# Patient Record
Sex: Female | Born: 1995 | Race: White | Hispanic: No | Marital: Single | Smoking: Never smoker
Health system: Southern US, Community
[De-identification: ages and names within clinical notes are randomized; demographics above are authoritative.]

## PROBLEM LIST (undated history)

## (undated) DIAGNOSIS — T7840XA Allergy, unspecified, initial encounter: Secondary | ICD-10-CM

## (undated) DIAGNOSIS — K219 Gastro-esophageal reflux disease without esophagitis: Secondary | ICD-10-CM

## (undated) HISTORY — DX: Allergy, unspecified, initial encounter: T78.40XA

## (undated) HISTORY — DX: Gastro-esophageal reflux disease without esophagitis: K21.9

---
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
## 2017-10-31 ENCOUNTER — Other Ambulatory Visit: Payer: Self-pay

## 2017-10-31 ENCOUNTER — Ambulatory Visit (INDEPENDENT_AMBULATORY_CARE_PROVIDER_SITE_OTHER): Payer: 59 | Admitting: Physician Assistant

## 2017-10-31 ENCOUNTER — Encounter: Payer: Self-pay | Admitting: Physician Assistant

## 2017-10-31 VITALS — BP 122/85 | HR 91 | Temp 98.4°F | Resp 16 | Ht 62.0 in | Wt 128.8 lb

## 2017-10-31 DIAGNOSIS — Z130 Encounter for screening for diseases of the blood and blood-forming organs and certain disorders involving the immune mechanism: Secondary | ICD-10-CM | POA: Diagnosis not present

## 2017-10-31 DIAGNOSIS — Z23 Encounter for immunization: Secondary | ICD-10-CM | POA: Diagnosis not present

## 2017-10-31 DIAGNOSIS — Z3041 Encounter for surveillance of contraceptive pills: Secondary | ICD-10-CM

## 2017-10-31 DIAGNOSIS — Z1329 Encounter for screening for other suspected endocrine disorder: Secondary | ICD-10-CM

## 2017-10-31 DIAGNOSIS — Z0000 Encounter for general adult medical examination without abnormal findings: Secondary | ICD-10-CM | POA: Diagnosis not present

## 2017-10-31 DIAGNOSIS — Q078 Other specified congenital malformations of nervous system: Secondary | ICD-10-CM | POA: Diagnosis not present

## 2017-10-31 LAB — POCT URINALYSIS DIP (MANUAL ENTRY)
Bilirubin, UA: NEGATIVE
Glucose, UA: NEGATIVE mg/dL
Ketones, POC UA: NEGATIVE mg/dL
Leukocytes, UA: NEGATIVE
Nitrite, UA: NEGATIVE
Protein Ur, POC: NEGATIVE mg/dL
Spec Grav, UA: 1.02 (ref 1.010–1.025)
Urobilinogen, UA: 0.2 E.U./dL
pH, UA: 6.5 (ref 5.0–8.0)

## 2017-10-31 MED ORDER — NORGESTIM-ETH ESTRAD TRIPHASIC 0.18/0.215/0.25 MG-25 MCG PO TABS
1.0000 | ORAL_TABLET | Freq: Every day | ORAL | 11 refills | Status: DC
Start: 2017-10-31 — End: 2018-07-30

## 2017-10-31 NOTE — Patient Instructions (Addendum)
Start taking Ortho Tri-Cyclen birth control.  Come back and see me in about 6 months for recheck of your symptoms.   Ethinyl Estradiol; Norgestimate tablets What is this medicine? ETHINYL ESTRADIOL; NORGESTIMATE (ETH in il es tra DYE ole; nor JES ti mate) is an oral contraceptive. The products combine two types of female hormones, an estrogen and a progestin. They are used to prevent ovulation and pregnancy. Some products are also used to treat acne in females. This medicine may be used for other purposes; ask your health care provider or pharmacist if you have questions. COMMON BRAND NAME(S): Estarylla, MONO-LINYAH, MonoNessa, Norgestimate/Ethinyl Estradiol, Ortho Tri-Cyclen, Ortho Tri-Cyclen Lo, Ortho-Cyclen, Previfem, Sprintec, Tri-Estarylla, TRI-LINYAH, Tri-Lo-Estarylla, Tri-Lo-Marzia, Tri-Lo-Sprintec, Tri-Previfem, Tri-Sprintec, Tri-VyLibra, Trinessa, Minerva Areola What should I tell my health care provider before I take this medicine? They need to know if you have or ever had any of these conditions: -abnormal vaginal bleeding -blood vessel disease or blood clots -breast, cervical, endometrial, ovarian, liver, or uterine cancer -diabetes -gallbladder disease -heart disease or recent heart attack -high blood pressure -high cholesterol -kidney disease -liver disease -migraine headaches -stroke -systemic lupus erythematosus (SLE) -tobacco smoker -an unusual or allergic reaction to estrogens, progestins, other medicines, foods, dyes, or preservatives -pregnant or trying to get pregnant -breast-feeding How should I use this medicine? Take this medicine by mouth. To reduce nausea, this medicine may be taken with food. Follow the directions on the prescription label. Take this medicine at the same time each day and in the order directed on the package. Do not take your medicine more often than directed. Contact your pediatrician regarding the use of this medicine in children.  Special care may be needed. This medicine has been used in female children who have started having menstrual periods. A patient package insert for the product will be given with each prescription and refill. Read this sheet carefully each time. The sheet may change frequently. Overdosage: If you think you have taken too much of this medicine contact a poison control center or emergency room at once. NOTE: This medicine is only for you. Do not share this medicine with others. What if I miss a dose? If you miss a dose, refer to the patient information sheet you received with your medicine for direction. If you miss more than one pill, this medicine may not be as effective and you may need to use another form of birth control. What may interact with this medicine? Do not take this medicine with the following medication: -dasabuvir; ombitasvir; paritaprevir; ritonavir -ombitasvir; paritaprevir; ritonavir This medicine may also interact with the following medications: -acetaminophen -antibiotics or medicines for infections, especially rifampin, rifabutin, rifapentine, and griseofulvin, and possibly penicillins or tetracyclines -aprepitant -ascorbic acid (vitamin C) -atorvastatin -barbiturate medicines, such as phenobarbital -bosentan -carbamazepine -caffeine -clofibrate -cyclosporine -dantrolene -doxercalciferol -felbamate -grapefruit juice -hydrocortisone -medicines for anxiety or sleeping problems, such as diazepam or temazepam -medicines for diabetes, including pioglitazone -mineral oil -modafinil -mycophenolate -nefazodone -oxcarbazepine -phenytoin -prednisolone -ritonavir or other medicines for HIV infection or AIDS -rosuvastatin -selegiline -soy isoflavones supplements -St. John's wort -tamoxifen or raloxifene -theophylline -thyroid hormones -topiramate -warfarin This list may not describe all possible interactions. Give your health care provider a list of all the  medicines, herbs, non-prescription drugs, or dietary supplements you use. Also tell them if you smoke, drink alcohol, or use illegal drugs. Some items may interact with your medicine. What should I watch for while using this medicine? Visit your doctor or health care professional for regular checks  on your progress. You will need a regular breast and pelvic exam and Pap smear while on this medicine. You should also discuss the need for regular mammograms with your health care professional, and follow his or her guidelines for these tests. This medicine can make your body retain fluid, making your fingers, hands, or ankles swell. Your blood pressure can go up. Contact your doctor or health care professional if you feel you are retaining fluid. Use an additional method of contraception during the first cycle that you take these tablets. If you have any reason to think you are pregnant, stop taking this medicine right away and contact your doctor or health care professional. If you are taking this medicine for hormone related problems, it may take several cycles of use to see improvement in your condition. Do not use this product if you smoke and are over 9 years of age. Smoking increases the risk of getting a blood clot or having a stroke while you are taking birth control pills, especially if you are more than 20 years old. If you are a smoker who is 69 years of age or younger, you are strongly advised not to smoke while taking birth control pills. This medicine can make you more sensitive to the sun. Keep out of the sun. If you cannot avoid being in the sun, wear protective clothing and use sunscreen. Do not use sun lamps or tanning beds/booths. If you wear contact lenses and notice visual changes, or if the lenses begin to feel uncomfortable, consult your eye care specialist. In some women, tenderness, swelling, or minor bleeding of the gums may occur. Notify your dentist if this happens. Brushing and  flossing your teeth regularly may help limit this. See your dentist regularly and inform your dentist of the medicines you are taking. If you are going to have elective surgery, you may need to stop taking this medicine before the surgery. Consult your health care professional for advice. This medicine does not protect you against HIV infection (AIDS) or any other sexually transmitted diseases. What side effects may I notice from receiving this medicine? Side effects that you should report to your doctor or health care professional as soon as possible: -breast tissue changes or discharge -changes in vaginal bleeding during your period or between your periods -chest pain -coughing up blood -dizziness or fainting spells -headaches or migraines -leg, arm or groin pain -severe or sudden headaches -stomach pain (severe) -sudden shortness of breath -sudden loss of coordination, especially on one side of the body -speech problems -symptoms of vaginal infection like itching, irritation or unusual discharge -tenderness in the upper abdomen -vomiting -weakness or numbness in the arms or legs, especially on one side of the body -yellowing of the eyes or skin Side effects that usually do not require medical attention (report to your doctor or health care professional if they continue or are bothersome): -breakthrough bleeding and spotting that continues beyond the 3 initial cycles of pills -breast tenderness -mood changes, anxiety, depression, frustration, anger, or emotional outbursts -increased sensitivity to sun or ultraviolet light -nausea -skin rash, acne, or Hirt spots on the skin -weight gain (slight) This list may not describe all possible side effects. Call your doctor for medical advice about side effects. You may report side effects to FDA at 1-800-FDA-1088. Where should I keep my medicine? Keep out of the reach of children. Store at room temperature between 15 and 30 degrees C (59  and 86 degrees F). Throw away any  unused medicine after the expiration date. NOTE: This sheet is a summary. It may not cover all possible information. If you have questions about this medicine, talk to your doctor, pharmacist, or health care provider.  2018 Elsevier/Gold Standard (2016-03-18 08:09:09)   Health Maintenance, Female Adopting a healthy lifestyle and getting preventive care can go a long way to promote health and wellness. Talk with your health care provider about what schedule of regular examinations is right for you. This is a good chance for you to check in with your provider about disease prevention and staying healthy. In between checkups, there are plenty of things you can do on your own. Experts have done a lot of research about which lifestyle changes and preventive measures are most likely to keep you healthy. Ask your health care provider for more information. Weight and diet Eat a healthy diet  Be sure to include plenty of vegetables, fruits, low-fat dairy products, and lean protein.  Do not eat a lot of foods high in solid fats, added sugars, or salt.  Get regular exercise. This is one of the most important things you can do for your health. ? Most adults should exercise for at least 150 minutes each week. The exercise should increase your heart rate and make you sweat (moderate-intensity exercise). ? Most adults should also do strengthening exercises at least twice a week. This is in addition to the moderate-intensity exercise.  Maintain a healthy weight  Body mass index (BMI) is a measurement that can be used to identify possible weight problems. It estimates body fat based on height and weight. Your health care provider can help determine your BMI and help you achieve or maintain a healthy weight.  For females 57 years of age and older: ? A BMI below 18.5 is considered underweight. ? A BMI of 18.5 to 24.9 is normal. ? A BMI of 25 to 29.9 is considered  overweight. ? A BMI of 30 and above is considered obese.  Watch levels of cholesterol and blood lipids  You should start having your blood tested for lipids and cholesterol at 20 years of age, then have this test every 5 years.  You may need to have your cholesterol levels checked more often if: ? Your lipid or cholesterol levels are high. ? You are older than 20 years of age. ? You are at high risk for heart disease.  Cancer screening Lung Cancer  Lung cancer screening is recommended for adults 85-89 years old who are at high risk for lung cancer because of a history of smoking.  A yearly low-dose CT scan of the lungs is recommended for people who: ? Currently smoke. ? Have quit within the past 15 years. ? Have at least a 30-pack-year history of smoking. A pack year is smoking an average of one pack of cigarettes a day for 1 year.  Yearly screening should continue until it has been 15 years since you quit.  Yearly screening should stop if you develop a health problem that would prevent you from having lung cancer treatment.  Breast Cancer  Practice breast self-awareness. This means understanding how your breasts normally appear and feel.  It also means doing regular breast self-exams. Let your health care provider know about any changes, no matter how small.  If you are in your 20s or 30s, you should have a clinical breast exam (CBE) by a health care provider every 1-3 years as part of a regular health exam.  If you are  65 or older, have a CBE every year. Also consider having a breast X-ray (mammogram) every year.  If you have a family history of breast cancer, talk to your health care provider about genetic screening.  If you are at high risk for breast cancer, talk to your health care provider about having an MRI and a mammogram every year.  Breast cancer gene (BRCA) assessment is recommended for women who have family members with BRCA-related cancers. BRCA-related cancers  include: ? Breast. ? Ovarian. ? Tubal. ? Peritoneal cancers.  Results of the assessment will determine the need for genetic counseling and BRCA1 and BRCA2 testing.  Cervical Cancer Your health care provider may recommend that you be screened regularly for cancer of the pelvic organs (ovaries, uterus, and vagina). This screening involves a pelvic examination, including checking for microscopic changes to the surface of your cervix (Pap test). You may be encouraged to have this screening done every 3 years, beginning at age 72.  For women ages 67-65, health care providers may recommend pelvic exams and Pap testing every 3 years, or they may recommend the Pap and pelvic exam, combined with testing for human papilloma virus (HPV), every 5 years. Some types of HPV increase your risk of cervical cancer. Testing for HPV may also be done on women of any age with unclear Pap test results.  Other health care providers may not recommend any screening for nonpregnant women who are considered low risk for pelvic cancer and who do not have symptoms. Ask your health care provider if a screening pelvic exam is right for you.  If you have had past treatment for cervical cancer or a condition that could lead to cancer, you need Pap tests and screening for cancer for at least 20 years after your treatment. If Pap tests have been discontinued, your risk factors (such as having a new sexual partner) need to be reassessed to determine if screening should resume. Some women have medical problems that increase the chance of getting cervical cancer. In these cases, your health care provider may recommend more frequent screening and Pap tests.  Colorectal Cancer  This type of cancer can be detected and often prevented.  Routine colorectal cancer screening usually begins at 20 years of age and continues through 20 years of age.  Your health care provider may recommend screening at an earlier age if you have risk factors  for colon cancer.  Your health care provider may also recommend using home test kits to check for hidden blood in the stool.  A small camera at the end of a tube can be used to examine your colon directly (sigmoidoscopy or colonoscopy). This is done to check for the earliest forms of colorectal cancer.  Routine screening usually begins at age 25.  Direct examination of the colon should be repeated every 5-10 years through 20 years of age. However, you may need to be screened more often if early forms of precancerous polyps or small growths are found.  Skin Cancer  Check your skin from head to toe regularly.  Tell your health care provider about any new moles or changes in moles, especially if there is a change in a mole's shape or color.  Also tell your health care provider if you have a mole that is larger than the size of a pencil eraser.  Always use sunscreen. Apply sunscreen liberally and repeatedly throughout the day.  Protect yourself by wearing long sleeves, pants, a wide-brimmed hat, and sunglasses whenever you  are outside.  Heart disease, diabetes, and high blood pressure  High blood pressure causes heart disease and increases the risk of stroke. High blood pressure is more likely to develop in: ? People who have blood pressure in the high end of the normal range (130-139/85-89 mm Hg). ? People who are overweight or obese. ? People who are African American.  If you are 51-4 years of age, have your blood pressure checked every 3-5 years. If you are 45 years of age or older, have your blood pressure checked every year. You should have your blood pressure measured twice-once when you are at a hospital or clinic, and once when you are not at a hospital or clinic. Record the average of the two measurements. To check your blood pressure when you are not at a hospital or clinic, you can use: ? An automated blood pressure machine at a pharmacy. ? A home blood pressure monitor.  If  you are between 12 years and 83 years old, ask your health care provider if you should take aspirin to prevent strokes.  Have regular diabetes screenings. This involves taking a blood sample to check your fasting blood sugar level. ? If you are at a normal weight and have a low risk for diabetes, have this test once every three years after 20 years of age. ? If you are overweight and have a high risk for diabetes, consider being tested at a younger age or more often. Preventing infection Hepatitis B  If you have a higher risk for hepatitis B, you should be screened for this virus. You are considered at high risk for hepatitis B if: ? You were born in a country where hepatitis B is common. Ask your health care provider which countries are considered high risk. ? Your parents were born in a high-risk country, and you have not been immunized against hepatitis B (hepatitis B vaccine). ? You have HIV or AIDS. ? You use needles to inject street drugs. ? You live with someone who has hepatitis B. ? You have had sex with someone who has hepatitis B. ? You get hemodialysis treatment. ? You take certain medicines for conditions, including cancer, organ transplantation, and autoimmune conditions.  Hepatitis C  Blood testing is recommended for: ? Everyone born from [birth year] through 1963. ? Anyone with known risk factors for hepatitis C.  Sexually transmitted infections (STIs)  You should be screened for sexually transmitted infections (STIs) including gonorrhea and chlamydia if: ? You are sexually active and are younger than 20 years of age. ? You are older than 20 years of age and your health care provider tells you that you are at risk for this type of infection. ? Your sexual activity has changed since you were last screened and you are at an increased risk for chlamydia or gonorrhea. Ask your health care provider if you are at risk.  If you do not have HIV, but are at risk, it may be recommended  that you take a prescription medicine daily to prevent HIV infection. This is called pre-exposure prophylaxis (PrEP). You are considered at risk if: ? You are sexually active and do not regularly use condoms or know the HIV status of your partner(s). ? You take drugs by injection. ? You are sexually active with a partner who has HIV.  Talk with your health care provider about whether you are at high risk of being infected with HIV. If you choose to begin PrEP, you should first be  tested for HIV. You should then be tested every 3 months for as long as you are taking PrEP. Pregnancy  If you are premenopausal and you may become pregnant, ask your health care provider about preconception counseling.  If you may become pregnant, take 400 to 800 micrograms (mcg) of folic acid every day.  If you want to prevent pregnancy, talk to your health care provider about birth control (contraception). Osteoporosis and menopause  Osteoporosis is a disease in which the bones lose minerals and strength with aging. This can result in serious bone fractures. Your risk for osteoporosis can be identified using a bone density scan.  If you are 68 years of age or older, or if you are at risk for osteoporosis and fractures, ask your health care provider if you should be screened.  Ask your health care provider whether you should take a calcium or vitamin D supplement to lower your risk for osteoporosis.  Menopause may have certain physical symptoms and risks.  Hormone replacement therapy may reduce some of these symptoms and risks. Talk to your health care provider about whether hormone replacement therapy is right for you. Follow these instructions at home:  Schedule regular health, dental, and eye exams.  Stay current with your immunizations.  Do not use any tobacco products including cigarettes, chewing tobacco, or electronic cigarettes.  If you are pregnant, do not drink alcohol.  If you are  breastfeeding, limit how much and how often you drink alcohol.  Limit alcohol intake to no more than 1 drink per day for nonpregnant women. One drink equals 12 ounces of beer, 5 ounces of wine, or 1 ounces of hard liquor.  Do not use street drugs.  Do not share needles.  Ask your health care provider for help if you need support or information about quitting drugs.  Tell your health care provider if you often feel depressed.  Tell your health care provider if you have ever been abused or do not feel safe at home. This information is not intended to replace advice given to you by your health care provider. Make sure you discuss any questions you have with your health care provider. Document Released: 01/21/2011 Document Revised: 12/14/2015 Document Reviewed: 04/11/2015 Elsevier Interactive Patient Education  2018 [employer].   IF you received an x-ray today, you will receive an invoice from [facility]. Please contact [facility] at [phone] with questions or concerns regarding your invoice.   IF you received labwork today, you will receive an invoice from [facility]. Please contact [facility] at [phone] with questions or concerns regarding your invoice.   Our billing staff will not be able to assist you with questions regarding bills from these companies.  You will be contacted with the lab results as soon as they are available. The fastest way to get your results is to activate your My Chart account. Instructions are located on the last page of this paperwork. If you have not heard from [place] regarding the results in 2 weeks, please contact this office.

## 2017-10-31 NOTE — Progress Notes (Signed)
Primary Care at [place], [place] [address] [phone]  Date:  10/31/2017   Name:  Claire Ayers   DOB:  14-Jan-1996   MRN:  [id number]  PCP:  Patient, No Pcp Per    Chief Complaint: New pt (pt will be needing a PE to complete form for Nursing program at [facility]) and Birth control (pt would like to discuss going on a different Birth control, currently taken [facility], "just not doing anything for acne and cramping, that it is suppose to help with.")   History of Present Illness:  This is a 22 y.o. female with PMH hiatal hernia who is presenting for CPE. 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. She wants to go into pediatric oncology. Needs form filled out today.  Complaints: cramping and acne. She would like different birth control.  Has tried Mononessa caused heavy clotting with menses. "didn't really help with cramping and acne"  She is currently taking Syeda x 5 months - c/o acne and cramping.   LMP: yesterday Contraception: OCPs.  Last pap: n/a Sexual history: active with one female partner.  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   Diet/Exercise: healthy. Works out several days/week Fam hx: family history includes Heart disease in her sister; Hypertension in her mother.  Tobacco/alcohol/substance use: no alcohol, no drug use, never smoker.   Review of Systems:  Review of Systems  Constitutional: Negative for chills, diaphoresis, fatigue and fever.  HENT: Negative for congestion, postnasal drip, rhinorrhea, sinus pressure, sneezing and sore throat.   Respiratory: Negative for cough, chest tightness, shortness of breath and wheezing.   Cardiovascular: Negative for chest pain and palpitations.  Gastrointestinal: Negative for abdominal pain, diarrhea, nausea and vomiting.  Genitourinary: Positive for menstrual problem. Negative for decreased urine volume, difficulty urinating, dysuria, enuresis,  flank pain, frequency, hematuria and urgency.  Musculoskeletal: Negative for back pain.  Skin: Positive for wound (acne).  Neurological: Negative for dizziness, weakness, light-headedness and headaches.    Patient Active Problem List   Diagnosis Date Noted  . Gastroesophageal reflux disease without esophagitis 09/22/2015  . History of hiatal hernia 09/22/2015    Prior to Admission medications   Medication Sig Start Date End Date Taking? Authorizing Provider  drospirenone-ethinyl estradiol (SYEDA) 3-0.03 MG tablet Take 1 tablet by mouth daily.   Yes [provider]  omeprazole (PRILOSEC) 20 MG capsule Take 20 mg by mouth daily.   Yes [provider]  ranitidine (ZANTAC) 150 MG tablet Take 150 mg by mouth 2 (two) times daily.   Yes [provider]  azelastine (OPTIVAR) 0.05 % ophthalmic solution Place 2 drops into the left eye 2 (two) times daily. Can use in right eye as needed Patient not taking: Reported on 10/31/2017 05/25/16   Emeterio Reeve, DO  norgestimate-ethinyl estradiol (MONONESSA) 0.25-35 MG-MCG tablet Take 1 tablet by mouth daily.    [provider]    No Known Allergies  Social History   Tobacco Use  . Smoking status: Never Smoker  . Smokeless tobacco: Never Used  Substance Use Topics  . Alcohol use: No  . Drug use: Not on file    Family History  Problem Relation Age of Onset  . Hypertension Mother   . Heart disease Sister     Medication list has been reviewed and updated.  Physical Examination:  Physical Exam  Constitutional: She is oriented to person, place, and time. She appears well-developed  and well-nourished. No distress.  HENT:  Head: Normocephalic and atraumatic.  Mouth/Throat: Oropharynx is clear and moist.  Eyes: Pupils are equal, round, and reactive to light. Conjunctivae and EOM are normal.  Cardiovascular: Normal rate, regular rhythm and normal heart sounds.  No murmur heard. Pulmonary/Chest: Effort  normal and breath sounds normal. She has no wheezes.  Musculoskeletal: Normal range of motion.  Neurological: She is alert and oriented to person, place, and time. She has normal reflexes.  Left eye lid tremor with speaking.   Skin: Skin is warm and dry.  Psychiatric: She has a normal mood and affect. Her behavior is normal. Judgment and thought content normal.  Vitals reviewed.   BP 122/85   Pulse 91   Temp 98.4 F (36.9 C) (Oral)   Resp 16   Ht 5' 2"  (1.575 m)   Wt 128 lb 12.8 oz (58.4 kg)   LMP 10/31/2017   SpO2 97%   BMI 23.56 kg/m   Assessment and Plan: 1. 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 Tdap given. Routine labs are pending. Anticipatory guidance discussed.  Plan to start ortho-tricycline. RTC in about 6 months for recheck.  2. Need for prophylactic vaccination with combined diphtheria-tetanus-pertussis (DTP) vaccine - Tdap vaccine greater than or equal to 7yo IM  3. 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 Screening, anemia, deficiency, iron - CBC with Differential/Platelet  5. Screening for endocrine disorder - POCT urinalysis dipstick - CMP14+EGFR  6. Encounter for birth control pills maintenance - Norgestimate-Ethinyl Estradiol Triphasic 0.18/0.215/0.25 MG-25 MCG tab; Take 1 tablet by mouth daily.  Dispense: 1 Package; Refill: 11   Whitney Nasreen Goedecke, PA-C  Primary Care at [place] 10/31/2017 10:58 AM

## 2017-11-01 LAB — CBC WITH DIFFERENTIAL/PLATELET
Basophils Absolute: 0 10*3/uL (ref 0.0–0.2)
Basos: 1 %
EOS (ABSOLUTE): 0.2 10*3/uL (ref 0.0–0.4)
Eos: 3 %
Hematocrit: 40.1 % (ref 34.0–46.6)
Hemoglobin: 13.4 g/dL (ref 11.1–15.9)
Immature Grans (Abs): 0 10*3/uL (ref 0.0–0.1)
Immature Granulocytes: 0 %
Lymphocytes Absolute: 2.2 10*3/uL (ref 0.7–3.1)
Lymphs: 37 %
MCH: 29.9 pg (ref 26.6–33.0)
MCHC: 33.4 g/dL (ref 31.5–35.7)
MCV: 90 fL (ref 79–97)
Monocytes Absolute: 0.5 10*3/uL (ref 0.1–0.9)
Monocytes: 8 %
Neutrophils Absolute: 2.9 10*3/uL (ref 1.4–7.0)
Neutrophils: 51 %
Platelets: 354 10*3/uL (ref 150–379)
RBC: 4.48 x10E6/uL (ref 3.77–5.28)
RDW: 12.5 % (ref 12.3–15.4)
WBC: 5.8 10*3/uL (ref 3.4–10.8)

## 2017-11-01 LAB — CMP14+EGFR
ALT: 13 IU/L (ref 0–32)
AST: 19 IU/L (ref 0–40)
Albumin/Globulin Ratio: 1.6 (ref 1.2–2.2)
Albumin: 4.4 g/dL (ref 3.5–5.5)
Alkaline Phosphatase: 59 IU/L (ref 39–117)
BUN/Creatinine Ratio: 13 (ref 9–23)
BUN: 7 mg/dL (ref 6–20)
Bilirubin Total: 0.3 mg/dL (ref 0.0–1.2)
CO2: 24 mmol/L (ref 20–29)
Calcium: 9.6 mg/dL (ref 8.7–10.2)
Chloride: 103 mmol/L (ref 96–106)
Creatinine, Ser: 0.52 mg/dL — ABNORMAL LOW (ref 0.57–1.00)
GFR calc Af Amer: 160 mL/min/{1.73_m2} (ref >59)
GFR calc non Af Amer: 139 mL/min/{1.73_m2} (ref >59)
Globulin, Total: 2.7 g/dL (ref 1.5–4.5)
Glucose: 80 mg/dL (ref 65–99)
Potassium: 4.4 mmol/L (ref 3.5–5.2)
Sodium: 141 mmol/L (ref 134–144)
Total Protein: 7.1 g/dL (ref 6.0–8.5)

## 2017-11-19 NOTE — Progress Notes (Signed)
Recheck BMP and UA at next OV.

## 2017-11-25 ENCOUNTER — Other Ambulatory Visit: Payer: Self-pay | Admitting: Physician Assistant

## 2017-11-25 ENCOUNTER — Ambulatory Visit (INDEPENDENT_AMBULATORY_CARE_PROVIDER_SITE_OTHER): Payer: 59 | Admitting: Physician Assistant

## 2017-11-25 ENCOUNTER — Ambulatory Visit: Payer: 59 | Admitting: Physician Assistant

## 2017-11-25 DIAGNOSIS — Z111 Encounter for screening for respiratory tuberculosis: Secondary | ICD-10-CM

## 2017-11-25 NOTE — Progress Notes (Signed)
Pt came for a lab only visit. Pt was not seen by provider.  

## 2017-11-28 LAB — QUANTIFERON-TB GOLD PLUS
QuantiFERON Mitogen Value: >10 [IU]/mL
QuantiFERON Nil Value: 0.01 [IU]/mL
QuantiFERON TB1 Ag Value: 0.01 IU/mL
QuantiFERON TB2 Ag Value: 0.01 [IU]/mL
QuantiFERON-TB Gold Plus: NEGATIVE

## 2018-07-29 ENCOUNTER — Encounter: Payer: Self-pay | Admitting: Physician Assistant

## 2018-07-30 ENCOUNTER — Other Ambulatory Visit: Payer: Self-pay | Admitting: *Deleted

## 2018-07-30 DIAGNOSIS — Z3041 Encounter for surveillance of contraceptive pills: Secondary | ICD-10-CM

## 2018-07-30 MED ORDER — NORGESTIM-ETH ESTRAD TRIPHASIC 0.18/0.215/0.25 MG-25 MCG PO TABS
1.0000 | ORAL_TABLET | Freq: Every day | ORAL | 3 refills | Status: DC
Start: 2018-07-30 — End: 2018-09-22

## 2018-09-22 ENCOUNTER — Other Ambulatory Visit: Payer: Self-pay | Admitting: *Deleted

## 2018-09-22 ENCOUNTER — Encounter: Payer: Self-pay | Admitting: Physician Assistant

## 2018-09-22 DIAGNOSIS — Z3041 Encounter for surveillance of contraceptive pills: Secondary | ICD-10-CM

## 2018-09-22 MED ORDER — NORGESTIM-ETH ESTRAD TRIPHASIC 0.18/0.215/0.25 MG-25 MCG PO TABS: 1.0000 | ORAL_TABLET | Freq: Every day | ORAL | 0 refills | Status: AC

## 2019-09-01 ENCOUNTER — Other Ambulatory Visit: Payer: Self-pay

## 2019-09-01 ENCOUNTER — Telehealth (INDEPENDENT_AMBULATORY_CARE_PROVIDER_SITE_OTHER): Payer: 59 | Admitting: Registered Nurse

## 2019-09-01 ENCOUNTER — Encounter: Payer: Self-pay | Admitting: Registered Nurse

## 2019-09-01 ENCOUNTER — Ambulatory Visit: Payer: 59 | Attending: Family

## 2019-09-01 ENCOUNTER — Other Ambulatory Visit: Payer: Self-pay | Admitting: Registered Nurse

## 2019-09-01 DIAGNOSIS — R110 Nausea: Secondary | ICD-10-CM

## 2019-09-01 DIAGNOSIS — R42 Dizziness and giddiness: Secondary | ICD-10-CM

## 2019-09-01 DIAGNOSIS — R0981 Nasal congestion: Secondary | ICD-10-CM

## 2019-09-01 DIAGNOSIS — Z20822 Contact with and (suspected) exposure to covid-19: Secondary | ICD-10-CM | POA: Insufficient documentation

## 2019-09-01 DIAGNOSIS — F411 Generalized anxiety disorder: Secondary | ICD-10-CM

## 2019-09-01 MED ORDER — ESCITALOPRAM OXALATE 10 MG PO TABS: 10.0000 mg | ORAL_TABLET | Freq: Every day | ORAL | 0 refills | Status: DC

## 2019-09-01 MED ORDER — ONDANSETRON HCL 4 MG PO TABS: 4.0000 mg | ORAL_TABLET | Freq: Three times a day (TID) | ORAL | 0 refills | Status: AC | PRN

## 2019-09-01 MED ORDER — ESCITALOPRAM OXALATE 5 MG PO TABS: 5.0000 mg | ORAL_TABLET | Freq: Every day | ORAL | 0 refills | Status: DC

## 2019-09-01 MED ORDER — GUAIFENESIN-DM 100-10 MG/5ML PO SYRP: 5.0000 mL | ORAL_SOLUTION | ORAL | 0 refills | Status: AC | PRN

## 2019-09-01 MED ORDER — BUSPIRONE HCL 5 MG PO TABS: 5.0000 mg | ORAL_TABLET | Freq: Three times a day (TID) | ORAL | 0 refills | Status: DC

## 2019-09-01 MED ORDER — BUPROPION HCL ER (SR) 100 MG PO TB12: 100.0000 mg | ORAL_TABLET | Freq: Two times a day (BID) | ORAL | 0 refills | Status: DC

## 2019-09-01 MED ORDER — MECLIZINE HCL 25 MG PO TABS: 25.0000 mg | ORAL_TABLET | Freq: Three times a day (TID) | ORAL | 0 refills | Status: AC | PRN

## 2019-09-01 NOTE — Progress Notes (Signed)
Claire Ayers called back - family members who have used escitalopram have experienced weight gain. She would prefer to use an alternative Will start bupropion 100mg  SR 12h with one tab daily for one week, then increase to twice daily dosing. Buspirone 5mg  PO tid PRN for anxiety.  Pt is agreeable to plan  Keep med check visit in 4-5 weeks  , NP

## 2019-09-01 NOTE — Progress Notes (Signed)
Telemedicine Encounter- SOAP NOTE Established Patient  This telephone encounter was conducted with the patient's (or proxy's) verbal consent via audio telecommunications: yes  Patient was instructed to have this encounter in a suitably private space; and to only have persons present to whom they give permission to participate. In addition, patient identity was confirmed by use of name plus two identifiers (DOB and address).  I discussed the limitations, risks, security and privacy concerns of performing an evaluation and management service by telephone and the availability of in person appointments. I also discussed with the patient that there may be a patient responsible charge related to this service. The patient expressed understanding and agreed to proceed.  I spent a total of 21 mintues talking with the patient or their proxy.  No chief complaint on file.   Subjective   Claire Ayers is a 24 y.o. established patient. Telephone visit today for COVID symptoms and anxiety  HPI Pt states that starting 4 days ago had some cough, congestion, and shob. She got tested for COVID immediately before this visit. States that she feels "off" with a lot of dizziness leading to some nausea. No major concerns. Feels warm but no fever. Denies chills. No changes to taste or smell. No known sick contacts. In nursing school but classes online.  Also notes that she has had ongoing anxiety. Past provider gave alprazolam PRN, but she found that she had been needing to take this relatively consistently. She is interested in finding an alternative agent. Cites sources of stress as COVID, wedding planning, her father has a terminal illness, and nursing school. No  SI/HI  Patient Active Problem List   Diagnosis Date Noted  . Darrall Dears jaw-winking syndrome ([place]) 10/31/2017  . Gastroesophageal reflux disease without esophagitis 09/22/2015  . History of hiatal hernia 09/22/2015    Past Medical History:   Diagnosis Date  . Allergy   . GERD (gastroesophageal reflux disease)     Current Outpatient Medications  Medication Sig Dispense Refill  . azelastine (OPTIVAR) 0.05 % ophthalmic solution Place 2 drops into the left eye 2 (two) times daily. Can use in right eye as needed (Patient not taking: Reported on 10/31/2017) 6 mL 1  . drospirenone-ethinyl estradiol (SYEDA) 3-0.03 MG tablet Take 1 tablet by mouth daily.    Marland Kitchen escitalopram (LEXAPRO) 10 MG tablet Take 1 tablet (10 mg total) by mouth daily. 45 tablet 0  . escitalopram (LEXAPRO) 5 MG tablet Take 1 tablet (5 mg total) by mouth daily. 7 tablet 0  . guaiFENesin-dextromethorphan (ROBITUSSIN DM) 100-10 MG/5ML syrup Take 5 mLs by mouth every 4 (four) hours as needed for cough. 118 mL 0  . meclizine (ANTIVERT) 25 MG tablet Take 1 tablet (25 mg total) by mouth 3 (three) times daily as needed for dizziness. 30 tablet 0  . norgestimate-ethinyl estradiol (MONONESSA) 0.25-35 MG-MCG tablet Take 1 tablet by mouth daily.    . Norgestimate-Ethinyl Estradiol Triphasic 0.18/0.215/0.25 MG-25 MCG tab Take 1 tablet by mouth daily. 3 Package 0  . omeprazole (PRILOSEC) 20 MG capsule Take 20 mg by mouth daily.    . ondansetron (ZOFRAN) 4 MG tablet Take 1 tablet (4 mg total) by mouth every 8 (eight) hours as needed for nausea or vomiting. 20 tablet 0  . ranitidine (ZANTAC) 150 MG tablet Take 150 mg by mouth 2 (two) times daily.     No current facility-administered medications for this visit.    No Known Allergies  Social History  Socioeconomic History  . Marital status: Single    Spouse name: Not on file  . Number of children: Not on file  . Years of education: Not on file  . Highest education level: Not on file  Occupational History  . Not on file  Tobacco Use  . Smoking status: Never Smoker  . Smokeless tobacco: Never Used  Substance and Sexual Activity  . Alcohol use: No  . Drug use: Not on file  . Sexual activity: Not on file  Other Topics  Concern  . Not on file  Social History Narrative  . Not on file   Social Determinants of Health   Financial Resource Strain:   . Difficulty of Paying Living Expenses: Not on file  Food Insecurity:   . Worried About Programme researcher, broadcasting/film/video in the Last Year: Not on file  . Ran Out of Food in the Last Year: Not on file  Transportation Needs:   . Lack of Transportation (Medical): Not on file  . Lack of Transportation (Non-Medical): Not on file  Physical Activity:   . Days of Exercise per Week: Not on file  . Minutes of Exercise per Session: Not on file  Stress:   . Feeling of Stress : Not on file  Social Connections:   . Frequency of Communication with Friends and Family: Not on file  . Frequency of Social Gatherings with Friends and Family: Not on file  . Attends Religious Services: Not on file  . Active Member of Clubs or Organizations: Not on file  . Attends Banker Meetings: Not on file  . Marital Status: Not on file  Intimate Partner Violence:   . Fear of Current or Ex-Partner: Not on file  . Emotionally Abused: Not on file  . Physically Abused: Not on file  . Sexually Abused: Not on file    ROS Per hpi   Objective   Vitals as reported by the patient: There were no vitals filed for this visit.  Diagnoses and all orders for this visit:  Nausea without vomiting -     meclizine (ANTIVERT) 25 MG tablet; Take 1 tablet (25 mg total) by mouth 3 (three) times daily as needed for dizziness. -     ondansetron (ZOFRAN) 4 MG tablet; Take 1 tablet (4 mg total) by mouth every 8 (eight) hours as needed for nausea or vomiting.  Dizziness -     meclizine (ANTIVERT) 25 MG tablet; Take 1 tablet (25 mg total) by mouth 3 (three) times daily as needed for dizziness.  Head congestion -     guaiFENesin-dextromethorphan (ROBITUSSIN DM) 100-10 MG/5ML syrup; Take 5 mLs by mouth every 4 (four) hours as needed for cough.  GAD (generalized anxiety disorder) -     escitalopram  (LEXAPRO) 5 MG tablet; Take 1 tablet (5 mg total) by mouth daily. -     escitalopram (LEXAPRO) 10 MG tablet; Take 1 tablet (10 mg total) by mouth daily.   PLAN  Guaifenesin-dextromethorphan, meclizine, and ondansetron for symptoms. Discussed red flags and reasons to return to clinic. Discussed supportive care.  Escitalopram: start with 5mg  PO qd for one week, then titrate up to 10mg  PO qd. This should help with her anxiety more appropriately than alprazolam. Will plan to have med check with her in 1 mo via telemed for this medication.  Patient encouraged to call clinic with any questions, comments, or concerns.   I discussed the assessment and treatment plan with the patient. The patient was  provided an opportunity to ask questions and all were answered. The patient agreed with the plan and demonstrated an understanding of the instructions.   The patient was advised to call back or seek an in-person evaluation if the symptoms worsen or if the condition fails to improve as anticipated.  I provided 21 minutes of non-face-to-face time during this encounter.  Janeece Agee, NP  Primary Care at [facility]

## 2019-09-01 NOTE — Telephone Encounter (Signed)
Spoke with patient and she would like another prescription. Claire Ayers is reaching out to the patient

## 2019-09-02 LAB — NOVEL CORONAVIRUS, NAA: SARS-CoV-2, NAA: NOT DETECTED

## 2019-09-03 ENCOUNTER — Telehealth: Payer: Self-pay | Admitting: Registered Nurse

## 2019-09-03 ENCOUNTER — Telehealth: Payer: Self-pay | Admitting: General Practice

## 2019-09-03 NOTE — Progress Notes (Signed)
Called pt LVM for her to call office . Pt needs to make appt with Rich within the next 4-5 weeks for med check

## 2019-09-03 NOTE — Telephone Encounter (Signed)
error 

## 2019-09-03 NOTE — Telephone Encounter (Signed)
Called pt LVM for her to call office . Pt needs to make appt with Rich within the next 4-5 weeks for med check  

## 2019-09-08 ENCOUNTER — Ambulatory Visit: Payer: 59 | Admitting: Registered Nurse

## 2019-09-08 ENCOUNTER — Other Ambulatory Visit: Payer: Self-pay

## 2019-09-08 ENCOUNTER — Encounter: Payer: Self-pay | Admitting: Registered Nurse

## 2019-09-08 VITALS — BP 110/72 | HR 83 | Temp 98.0°F | Ht 62.0 in | Wt 131.4 lb

## 2019-09-08 DIAGNOSIS — R0981 Nasal congestion: Secondary | ICD-10-CM

## 2019-09-08 DIAGNOSIS — J0110 Acute frontal sinusitis, unspecified: Secondary | ICD-10-CM

## 2019-09-08 MED ORDER — AMOXICILLIN 875 MG PO TABS: 875.0000 mg | ORAL_TABLET | Freq: Two times a day (BID) | ORAL | 0 refills | Status: DC

## 2019-09-08 MED ORDER — OXYMETAZOLINE HCL 0.05 % NA SOLN: 1.0000 | Freq: Two times a day (BID) | NASAL | 0 refills | Status: AC

## 2019-09-08 MED ORDER — FLUTICASONE PROPIONATE 50 MCG/ACT NA SUSP: 2.0000 | Freq: Every day | NASAL | 6 refills | Status: AC

## 2019-09-08 NOTE — Patient Instructions (Signed)
° ° ° °  If you have lab work done today you will be contacted with your lab results within the next 2 weeks.  If you have not heard from us then please contact us. The fastest way to get your results is to register for My Chart. ° ° °IF you received an x-ray today, you will receive an invoice from [facility]. Please contact [facility] at [phone] with questions or concerns regarding your invoice.  ° °IF you received labwork today, you will receive an invoice from [facility]. Please contact [facility] at [phone] with questions or concerns regarding your invoice.  ° °Our billing staff will not be able to assist you with questions regarding bills from these companies. ° °You will be contacted with the lab results as soon as they are available. The fastest way to get your results is to activate your My Chart account. Instructions are located on the last page of this paperwork. If you have not heard from us regarding the results in 2 weeks, please contact this office. °  ° ° ° °

## 2019-09-10 ENCOUNTER — Other Ambulatory Visit: Payer: Self-pay | Admitting: Registered Nurse

## 2019-09-10 DIAGNOSIS — J0110 Acute frontal sinusitis, unspecified: Secondary | ICD-10-CM

## 2019-09-10 MED ORDER — AMOXICILLIN 875 MG PO TABS: 875.0000 mg | ORAL_TABLET | Freq: Two times a day (BID) | ORAL | 0 refills | Status: AC

## 2019-09-10 NOTE — Telephone Encounter (Signed)
I was calling the pharmacy to see if they have received the prescription but they are currently not open.

## 2019-09-13 ENCOUNTER — Encounter: Payer: Self-pay | Admitting: Registered Nurse

## 2019-09-13 ENCOUNTER — Other Ambulatory Visit: Payer: Self-pay | Admitting: Registered Nurse

## 2019-09-13 DIAGNOSIS — K219 Gastro-esophageal reflux disease without esophagitis: Secondary | ICD-10-CM

## 2019-09-13 MED ORDER — OMEPRAZOLE 40 MG PO CPDR: 40.0000 mg | DELAYED_RELEASE_CAPSULE | Freq: Every day | ORAL | 3 refills | Status: AC

## 2019-09-13 NOTE — Telephone Encounter (Signed)
Patient Prescription for Prilosec was not at the pharmacy , and wanted to know about the high dosage.

## 2019-09-14 ENCOUNTER — Encounter: Payer: Self-pay | Admitting: Registered Nurse

## 2019-09-14 NOTE — Progress Notes (Signed)
Acute Office Visit  Subjective:    Patient ID: Claire Ayers, female    DOB: Dec 18, 1995, 24 y.o.   MRN: [id number]  Chief Complaint  Patient presents with  . Headache    patient has had a headache for the past 3-4 goes away and comes right back , she said it feel like pressure behind forehead and eyes hurt as well. per patient she has taken some tylenol for some relief , but not helping.     HPI Patient is in today for ongoing sinusitis. Headache worsening. Pressure behind eyes. OTCs not providing relief. No lower respiratory symptoms. No nvd or fever. No cough. Denies GI symptoms. Denies sick contacts. Usually gets sinus infections about once each year - this feels similar.   Past Medical History:  Diagnosis Date  . Allergy   . GERD (gastroesophageal reflux disease)     History reviewed. No pertinent surgical history.  Family History  Problem Relation Age of Onset  . Hypertension Mother   . Heart disease Sister     Social History   Socioeconomic History  . Marital status: Single    Spouse name: Not on file  . Number of children: Not on file  . Years of education: Not on file  . Highest education level: Not on file  Occupational History  . Not on file  Tobacco Use  . Smoking status: Never Smoker  . Smokeless tobacco: Never Used  Substance and Sexual Activity  . Alcohol use: No  . Drug use: Not on file  . Sexual activity: Not on file  Other Topics Concern  . Not on file  Social History Narrative  . Not on file   Social Determinants of Health   Financial Resource Strain:   . Difficulty of Paying Living Expenses: Not on file  Food Insecurity:   . Worried About Programme researcher, broadcasting/film/video in the Last Year: Not on file  . Ran Out of Food in the Last Year: Not on file  Transportation Needs:   . Lack of Transportation (Medical): Not on file  . Lack of Transportation (Non-Medical): Not on file  Physical Activity:   . Days of Exercise per Week: Not on file  . Minutes  of Exercise per Session: Not on file  Stress:   . Feeling of Stress : Not on file  Social Connections:   . Frequency of Communication with Friends and Family: Not on file  . Frequency of Social Gatherings with Friends and Family: Not on file  . Attends Religious Services: Not on file  . Active Member of Clubs or Organizations: Not on file  . Attends Banker Meetings: Not on file  . Marital Status: Not on file  Intimate Partner Violence:   . Fear of Current or Ex-Partner: Not on file  . Emotionally Abused: Not on file  . Physically Abused: Not on file  . Sexually Abused: Not on file    Outpatient Medications Prior to Visit  Medication Sig Dispense Refill  . buPROPion (WELLBUTRIN SR) 100 MG 12 hr tablet Take 1 tablet (100 mg total) by mouth 2 (two) times daily. Once daily dosing for first 7 days 90 tablet 0  . busPIRone (BUSPAR) 5 MG tablet Take 1 tablet (5 mg total) by mouth 3 (three) times daily. 90 tablet 0  . guaiFENesin-dextromethorphan (ROBITUSSIN DM) 100-10 MG/5ML syrup Take 5 mLs by mouth every 4 (four) hours as needed for cough. 118 mL 0  . meclizine (ANTIVERT) 25  MG tablet Take 1 tablet (25 mg total) by mouth 3 (three) times daily as needed for dizziness. 30 tablet 0  . Norgestimate-Ethinyl Estradiol Triphasic 0.18/0.215/0.25 MG-25 MCG tab Take 1 tablet by mouth daily. 3 Package 0  . ondansetron (ZOFRAN) 4 MG tablet Take 1 tablet (4 mg total) by mouth every 8 (eight) hours as needed for nausea or vomiting. 20 tablet 0  . ranitidine (ZANTAC) 150 MG tablet Take 150 mg by mouth 2 (two) times daily.    Marland Kitchen omeprazole (PRILOSEC) 20 MG capsule Take 20 mg by mouth daily.    Marland Kitchen azelastine (OPTIVAR) 0.05 % ophthalmic solution Place 2 drops into the left eye 2 (two) times daily. Can use in right eye as needed (Patient not taking: Reported on 10/31/2017) 6 mL 1  . drospirenone-ethinyl estradiol (SYEDA) 3-0.03 MG tablet Take 1 tablet by mouth daily.    . norgestimate-ethinyl  estradiol (MONONESSA) 0.25-35 MG-MCG tablet Take 1 tablet by mouth daily.     No facility-administered medications prior to visit.    No Known Allergies  Review of Systems  Constitutional: Negative.   HENT: Positive for congestion, sinus pressure and sinus pain. Negative for dental problem, drooling, ear discharge, ear pain, facial swelling, hearing loss, mouth sores, nosebleeds, postnasal drip, rhinorrhea, sneezing, sore throat, tinnitus, trouble swallowing and voice change.   Eyes: Negative.   Respiratory: Negative.   Cardiovascular: Negative.   Gastrointestinal: Negative.   Endocrine: Negative.   Genitourinary: Negative.   Musculoskeletal: Negative.   Skin: Negative.   Allergic/Immunologic: Negative.   Neurological: Negative.   Hematological: Negative.   Psychiatric/Behavioral: Negative.   All other systems reviewed and are negative.      Objective:    Physical Exam Vitals and nursing note reviewed.  Constitutional:      General: She is not in acute distress.    Appearance: She is well-developed and normal weight. She is not ill-appearing, toxic-appearing or diaphoretic.  HENT:     Head: Normocephalic and atraumatic.     Mouth/Throat:     Mouth: Mucous membranes are moist.     Pharynx: Oropharynx is clear.  Cardiovascular:     Heart sounds: Normal heart sounds. No murmur.  Pulmonary:     Effort: Pulmonary effort is normal. No respiratory distress.  Musculoskeletal:     Cervical back: Normal range of motion and neck supple.  Lymphadenopathy:     Cervical: No cervical adenopathy.  Skin:    General: Skin is warm and dry.     Capillary Refill: Capillary refill takes less than 2 seconds.     Coloration: Skin is not cyanotic or pale.     Findings: No erythema or rash.  Neurological:     Mental Status: She is alert.  Psychiatric:        Mood and Affect: Mood normal. Mood is not anxious or depressed.        Speech: Speech normal.        Behavior: Behavior normal.  Behavior is not agitated.        Cognition and Memory: Cognition is not impaired. Memory is not impaired.     BP 110/72   Pulse 83   Temp 98 F (36.7 C) (Temporal)   Ht 5\' 2"  (1.575 m)   Wt 131 lb 6.4 oz (59.6 kg)   BMI 24.03 kg/m  Wt Readings from Last 3 Encounters:  09/08/19 131 lb 6.4 oz (59.6 kg)  10/31/17 128 lb 12.8 oz (58.4 kg) (52 %, Z= 0.06)*  05/25/16 128 lb (58.1 kg) (57 %, Z= 0.19)*   * Growth percentiles are based on CDC (Girls, 2-20 Years) data.    Health Maintenance Due  Topic Date Due  . HIV Screening  04/16/2013  . PAP-Cervical Cytology Screening  04/17/2019  . PAP SMEAR-Modifier  04/17/2019    There are no preventive care reminders to display for this patient.   No results found for: TSH Lab Results  Component Value Date   WBC 5.8 10/31/2017   HGB 13.4 10/31/2017   HCT 40.1 10/31/2017   MCV 90 10/31/2017   PLT 354 10/31/2017   Lab Results  Component Value Date   NA 141 10/31/2017   K 4.4 10/31/2017   CO2 24 10/31/2017   GLUCOSE 80 10/31/2017   BUN 7 10/31/2017   CREATININE 0.52 (L) 10/31/2017   BILITOT 0.3 10/31/2017   ALKPHOS 59 10/31/2017   AST 19 10/31/2017   ALT 13 10/31/2017   PROT 7.1 10/31/2017   ALBUMIN 4.4 10/31/2017   CALCIUM 9.6 10/31/2017   No results found for: CHOL No results found for: HDL No results found for: LDLCALC No results found for: TRIG No results found for: CHOLHDL No results found for: PJKD3O     Assessment & Plan:   Problem List Items Addressed This Visit    None    Visit Diagnoses    Acute non-recurrent frontal sinusitis    -  Primary   Relevant Medications   fluticasone (FLONASE) 50 MCG/ACT nasal spray   oxymetazoline (AFRIN) 0.05 % nasal spray   Sinus congestion       Relevant Medications   fluticasone (FLONASE) 50 MCG/ACT nasal spray   oxymetazoline (AFRIN) 0.05 % nasal spray       Meds ordered this encounter  Medications  . DISCONTD: amoxicillin (AMOXIL) 875 MG tablet    Sig: Take 1  tablet (875 mg total) by mouth 2 (two) times daily.    Dispense:  20 tablet    Refill:  0    Order Specific Question:   Supervising Provider    Answer:   Collie Siad A [id number]  . fluticasone (FLONASE) 50 MCG/ACT nasal spray    Sig: Place 2 sprays into both nostrils daily.    Dispense:  16 g    Refill:  6    Order Specific Question:   Supervising Provider    Answer:   Collie Siad A [id number]  . oxymetazoline (AFRIN) 0.05 % nasal spray    Sig: Place 1 spray into both nostrils 2 (two) times daily.    Dispense:  30 mL    Refill:  0    Order Specific Question:   Supervising Provider    Answer:   Doristine Bosworth [id number]   PLAN  Amoxicillin 875mg  PO bid for 10 days  flonase and afrin for symptoms relief  Return to clinic as needed  Patient encouraged to call clinic with any questions, comments, or concerns.  , NP

## 2019-09-15 ENCOUNTER — Encounter: Payer: Self-pay | Admitting: Registered Nurse

## 2019-09-15 ENCOUNTER — Other Ambulatory Visit: Payer: Self-pay | Admitting: Registered Nurse

## 2019-09-15 DIAGNOSIS — R0981 Nasal congestion: Secondary | ICD-10-CM

## 2019-09-15 NOTE — Telephone Encounter (Signed)
Patient is not feeling better after the antibiotics.  Please Advise.

## 2019-09-18 ENCOUNTER — Other Ambulatory Visit: Payer: Self-pay

## 2019-09-18 ENCOUNTER — Encounter (HOSPITAL_COMMUNITY): Payer: Self-pay | Admitting: Emergency Medicine

## 2019-09-18 ENCOUNTER — Emergency Department (HOSPITAL_COMMUNITY)
Admission: EM | Admit: 2019-09-18 | Discharge: 2019-09-19 | Disposition: A | Discharge status: Home / Self Care | Payer: 59 | Attending: Emergency Medicine | Admitting: Emergency Medicine

## 2019-09-18 DIAGNOSIS — R519 Headache, unspecified: Secondary | ICD-10-CM | POA: Insufficient documentation

## 2019-09-18 DIAGNOSIS — Z79899 Other long term (current) drug therapy: Secondary | ICD-10-CM | POA: Insufficient documentation

## 2019-09-18 DIAGNOSIS — R42 Dizziness and giddiness: Secondary | ICD-10-CM | POA: Insufficient documentation

## 2019-09-18 NOTE — ED Triage Notes (Signed)
Patient reports frontal headache for 3 weeks , denies head injury , mild nausea /no photophobia .

## 2019-09-19 ENCOUNTER — Emergency Department (HOSPITAL_COMMUNITY): Payer: 59

## 2019-09-19 LAB — COMPREHENSIVE METABOLIC PANEL
ALT: 12 U/L (ref 0–44)
AST: 17 U/L (ref 15–41)
Albumin: 3.7 g/dL (ref 3.5–5.0)
Alkaline Phosphatase: 47 U/L (ref 38–126)
Anion gap: 10 (ref 5–15)
BUN: 8 mg/dL (ref 6–20)
CO2: 22 mmol/L (ref 22–32)
Calcium: 9 mg/dL (ref 8.9–10.3)
Chloride: 106 mmol/L (ref 98–111)
Creatinine, Ser: 0.62 mg/dL (ref 0.44–1.00)
GFR calc Af Amer: >60 mL/min (ref >60)
GFR calc non Af Amer: >60 mL/min (ref >60)
Glucose, Bld: 116 mg/dL — ABNORMAL HIGH (ref 70–99)
Potassium: 3.5 mmol/L (ref 3.5–5.1)
Sodium: 138 mmol/L (ref 135–145)
Total Bilirubin: 0.2 mg/dL — ABNORMAL LOW (ref 0.3–1.2)
Total Protein: 6.7 g/dL (ref 6.5–8.1)

## 2019-09-19 LAB — CBC WITH DIFFERENTIAL/PLATELET
Abs Immature Granulocytes: 0.03 10*3/uL (ref 0.00–0.07)
Basophils Absolute: 0.1 10*3/uL (ref 0.0–0.1)
Basophils Relative: 1 %
Eosinophils Absolute: 0.2 10*3/uL (ref 0.0–0.5)
Eosinophils Relative: 2 %
HCT: 38.4 % (ref 36.0–46.0)
Hemoglobin: 12.8 g/dL (ref 12.0–15.0)
Immature Granulocytes: 0 %
Lymphocytes Relative: 44 %
Lymphs Abs: 4.1 10*3/uL — ABNORMAL HIGH (ref 0.7–4.0)
MCH: 30.4 pg (ref 26.0–34.0)
MCHC: 33.3 g/dL (ref 30.0–36.0)
MCV: 91.2 fL (ref 80.0–100.0)
Monocytes Absolute: 0.6 10*3/uL (ref 0.1–1.0)
Monocytes Relative: 7 %
Neutro Abs: 4.3 10*3/uL (ref 1.7–7.7)
Neutrophils Relative %: 46 %
Platelets: 343 10*3/uL (ref 150–400)
RBC: 4.21 MIL/uL (ref 3.87–5.11)
RDW: 12.3 % (ref 11.5–15.5)
WBC: 9.3 10*3/uL (ref 4.0–10.5)
nRBC: 0 % (ref 0.0–0.2)

## 2019-09-19 LAB — I-STAT BETA HCG BLOOD, ED (MC, WL, AP ONLY): I-stat hCG, quantitative: <5 m[IU]/mL (ref <5)

## 2019-09-19 MED ORDER — SODIUM CHLORIDE 0.9 % IV BOLUS
1000.0000 mL | Freq: Once | INTRAVENOUS | Status: AC
  Administered 2019-09-19: 01:00:00 1000 mL via INTRAVENOUS

## 2019-09-19 MED ORDER — GADOBUTROL 1 MMOL/ML IV SOLN
6.5000 mL | Freq: Once | INTRAVENOUS | Status: AC | PRN
  Administered 2019-09-19: 6.5 mL via INTRAVENOUS

## 2019-09-19 MED ORDER — PROCHLORPERAZINE EDISYLATE 10 MG/2ML IJ SOLN
10.0000 mg | Freq: Once | INTRAMUSCULAR | Status: AC
  Administered 2019-09-19: 02:00:00 10 mg via INTRAVENOUS
  Filled 2019-09-19: qty 2

## 2019-09-19 MED ORDER — DIPHENHYDRAMINE HCL 50 MG/ML IJ SOLN
25.0000 mg | Freq: Once | INTRAMUSCULAR | Status: AC
  Administered 2019-09-19: 02:00:00 25 mg via INTRAVENOUS
  Filled 2019-09-19: qty 1

## 2019-09-19 NOTE — ED Notes (Signed)
Discharge instructions reviewed with pt. Pt verbalized understanding.   

## 2019-09-19 NOTE — ED Notes (Signed)
Pt transported to MRI 

## 2019-09-19 NOTE — ED Provider Notes (Signed)
[facility] EMERGENCY DEPARTMENT Provider Note   CSN: [id number] Arrival date & time: 09/18/19  2316     History Chief Complaint  Patient presents with  . Headache    Claire Ayers is a 24 y.o. female.  HPI     This is a 24 year old female who presents with headache and vertigo.  Patient reports approximately 3 weeks ago she began to experience vertigo symptoms.  She describes room spinning dizziness that was worse with range of motion of her head.  She saw her primary physician.  She was initially treated for sinus infection with decongestants.  She was also given meclizine.  Her symptoms did not improve.  She subsequently went to urgent care.  She was told she might have an ear infection but again did not improve.  She subsequently has had a 10-day course of antibiotics but continues to experience intermittent vertigo and has had a persistent headache.  She describes the headache as constant and frontal in nature.  It is dull.  It is currently 5 out of 10.  She has taken all the medications above as well as sinus rinses, and ibuprofen.  She denies any significant nasal congestion.  No neck stiffness or fevers.  She denies any vision changes, weakness, numbness, tingling, strokelike symptoms.  She does not have any history of headaches.  Past Medical History:  Diagnosis Date  . Allergy   . GERD (gastroesophageal reflux disease)     Patient Active Problem List   Diagnosis Date Noted  . 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 Gastroesophageal reflux disease without esophagitis 09/22/2015  . History of hiatal hernia 09/22/2015    History reviewed. No pertinent surgical history.   OB History   No obstetric history on file.     Family History  Problem Relation Age of Onset  . Hypertension Mother   . Heart disease Sister     Social History   Tobacco Use  . Smoking status: Never Smoker  . Smokeless tobacco: Never Used  Substance Use Topics   . Alcohol use: No  . Drug use: Not on file    Home Medications Prior to Admission medications   Medication Sig Start Date End Date Taking? Authorizing Provider  amoxicillin (AMOXIL) 875 MG tablet Take 1 tablet (875 mg total) by mouth 2 (two) times daily. 09/10/19   Maximiano Coss, NP  buPROPion [facility] SR) 100 MG 12 hr tablet Take 1 tablet (100 mg total) by mouth 2 (two) times daily. Once daily dosing for first 7 days 09/01/19   Maximiano Coss, NP  busPIRone (BUSPAR) 5 MG tablet Take 1 tablet (5 mg total) by mouth 3 (three) times daily. 09/01/19   Maximiano Coss, NP  fluticasone (FLONASE) 50 MCG/ACT nasal spray Place 2 sprays into both nostrils daily. 09/08/19   Maximiano Coss, NP  guaiFENesin-dextromethorphan (ROBITUSSIN DM) 100-10 MG/5ML syrup Take 5 mLs by mouth every 4 (four) hours as needed for cough. 09/01/19   Maximiano Coss, NP  meclizine (ANTIVERT) 25 MG tablet Take 1 tablet (25 mg total) by mouth 3 (three) times daily as needed for dizziness. 09/01/19   Maximiano Coss, NP  Norgestimate-Ethinyl Estradiol Triphasic 0.18/0.215/0.25 MG-25 MCG tab Take 1 tablet by mouth daily. 09/22/18   Forrest Moron, MD  omeprazole (PRILOSEC) 40 MG capsule Take 1 capsule (40 mg total) by mouth daily. 09/13/19   Maximiano Coss, NP  ondansetron (ZOFRAN) 4 MG tablet Take 1 tablet (4 mg total) by mouth every  8 (eight) hours as needed for nausea or vomiting. 09/01/19   Janeece Agee, NP  oxymetazoline (AFRIN) 0.05 % nasal spray Place 1 spray into both nostrils 2 (two) times daily. 09/08/19   Janeece Agee, NP  ranitidine (ZANTAC) 150 MG tablet Take 150 mg by mouth 2 (two) times daily.    [provider]    Allergies    Patient has no known allergies.  Review of Systems   Review of Systems  Constitutional: Negative for fever.  HENT: Negative for congestion.   Respiratory: Negative for shortness of breath.   Cardiovascular: Negative for chest pain.  Gastrointestinal: Negative for  abdominal pain, nausea and vomiting.  Neurological: Positive for dizziness and headaches. Negative for weakness and numbness.  All other systems reviewed and are negative.   Physical Exam Updated Vital Signs BP 121/78 (BP Location: Right Arm)   Pulse (!) 114   Temp 98.3 F (36.8 C) (Oral)   Resp 17   Ht 1.575 m (5\' 2" )   Wt 65 kg   LMP 08/28/2019   SpO2 97%   BMI 26.21 kg/m   Physical Exam Vitals and nursing note reviewed.  Constitutional:      Appearance: She is well-developed. She is not ill-appearing.  HENT:     Head: Normocephalic and atraumatic.     Mouth/Throat:     Mouth: Mucous membranes are moist.  Eyes:     Extraocular Movements:     Right eye: No nystagmus.     Left eye: No nystagmus.     Pupils: Pupils are equal, round, and reactive to light.  Neck:     Comments: No meningismus Cardiovascular:     Rate and Rhythm: Normal rate and regular rhythm.     Heart sounds: Normal heart sounds.  Pulmonary:     Effort: Pulmonary effort is normal. No respiratory distress.     Breath sounds: No wheezing.  Abdominal:     General: Bowel sounds are normal.     Palpations: Abdomen is soft.  Musculoskeletal:     Cervical back: Normal range of motion and neck supple.  Skin:    General: Skin is warm and dry.  Neurological:     Mental Status: She is alert and oriented to person, place, and time.     Comments: Cranial nerves II through XII intact, 5 out of 5 strength in all 4 extremities, no dysmetria to finger-nose-finger  Psychiatric:        Mood and Affect: Mood is anxious.     ED Results / Procedures / Treatments   Labs (all labs ordered are listed, but only abnormal results are displayed) Labs Reviewed  CBC WITH DIFFERENTIAL/PLATELET - Abnormal; Notable for the following components:      Result Value   Lymphs Abs 4.1 (*)    All other components within normal limits  COMPREHENSIVE METABOLIC PANEL - Abnormal; Notable for the following components:   Glucose, Bld  116 (*)    Total Bilirubin 0.2 (*)    All other components within normal limits  I-STAT BETA HCG BLOOD, ED ([facility], [facility], [facility] ONLY)    EKG None  Radiology MR MRV HEAD W WO CONTRAST  Result Date: 09/19/2019 CLINICAL DATA:  Persistent frontal headache over the last 3 weeks. Assess for venous thrombosis. EXAM: MR VENOGRAM HEAD WITHOUT AND WITH CONTRAST TECHNIQUE: Angiographic images of the intracranial venous structures were obtained using MRV technique without and with intravenous contrast. COMPARISON:  None. CONTRAST:  6.5 cc Gadavist FINDINGS:  Superior sagittal sinus is widely patent. Transverse and sigmoid sinuses are normal. Both jugular veins show flow. No superficial thrombosis identified. Deep system appears normal. No abnormal brain or leptomeningeal enhancement is identified. Brain appears normal on sequences employed for the vascular studies. IMPRESSION: Normal intracranial MR venography. No sign of venous thrombosis as an etiology for headache. Electronically Signed   By: Paulina Fusi M.D.   On: 09/19/2019 03:46    Procedures Procedures (including critical care time)  Medications Ordered in ED Medications  sodium chloride 0.9 % bolus 1,000 mL (0 mLs Intravenous Stopped 09/19/19 0224)  prochlorperazine (COMPAZINE) injection 10 mg (10 mg Intravenous Given 09/19/19 0130)  diphenhydrAMINE (BENADRYL) injection 25 mg (25 mg Intravenous Given 09/19/19 0130)  gadobutrol (GADAVIST) 1 MMOL/ML injection 6.5 mL (6.5 mLs Intravenous Contrast Given 09/19/19 0331)    ED Course  I have reviewed the triage vital signs and the nursing notes.  Pertinent labs & imaging results that were available during my care of the patient were reviewed by me and considered in my medical decision making (see chart for details).    MDM Rules/Calculators/A&P                      Patient presents with a headache x3 weeks. Also reports vertigo symptoms although those have improved at this time. She has been treated for a  sinus infection and has taken multiple medications with no relief. She does report that her father has a terminal brain tumor. Her neurologic exam is normal. She has no visual field defects. She is on birth control with would put her at risk for venous sinus thrombosis.  Is very anxious appearing.  She is tachycardic on evaluation.  Given duration of symptoms and risk factors, will obtain MRI to rule out venous dural thrombosis or other mass.  Patient was given a migraine cocktail.  MRI is reassuring.  On recheck, patient states she is sleepy but her headache is improved.  Discussed results with the patient.  After history, exam, and medical workup I feel the patient has been appropriately medically screened and is safe for discharge home. Pertinent diagnoses were discussed with the patient. Patient was given return precautions.  Final Clinical Impression(s) / ED Diagnoses Final diagnoses:  Bad headache  Vertigo    Rx / DC Orders ED Discharge Orders    None       Zarek Relph, Mayer Masker, MD 09/19/19 [phone]

## 2019-09-19 NOTE — ED Notes (Signed)
Pt returned from MRI °

## 2019-09-19 NOTE — ED Notes (Signed)
MRI called, pt next to go to MRI.

## 2019-09-19 NOTE — ED Notes (Signed)
Pt up to restroom independently with a steady gait.

## 2019-09-19 NOTE — Discharge Instructions (Addendum)
You were seen today for ongoing headache and dizziness.  Your work-up is reassuring including MRI.  Follow-up with your primary physician for any ongoing issues.  If you continue to have issues with headache, you may need to be referred to neurology.

## 2019-09-19 NOTE — ED Notes (Signed)
Pt alert & oriented x 4. Pt reports a persistent frontal headache that has been going on for 3 weeks. Pt states she has been to PCP 4 times without relief. Pt denies photophobia, but endorses nausea. Neuro exam intact.

## 2019-09-24 ENCOUNTER — Other Ambulatory Visit: Payer: Self-pay | Admitting: Registered Nurse

## 2019-09-24 DIAGNOSIS — F411 Generalized anxiety disorder: Secondary | ICD-10-CM

## 2019-10-01 ENCOUNTER — Ambulatory Visit: Payer: 59 | Admitting: Registered Nurse

## 2019-10-11 ENCOUNTER — Ambulatory Visit: Payer: 59 | Admitting: Registered Nurse

## 2019-10-26 ENCOUNTER — Encounter: Payer: Self-pay | Admitting: Registered Nurse

## 2019-12-22 ENCOUNTER — Other Ambulatory Visit: Payer: Self-pay | Admitting: Registered Nurse

## 2019-12-22 DIAGNOSIS — F411 Generalized anxiety disorder: Secondary | ICD-10-CM

## 2020-03-26 ENCOUNTER — Other Ambulatory Visit: Payer: Self-pay | Admitting: Registered Nurse

## 2020-03-26 DIAGNOSIS — F411 Generalized anxiety disorder: Secondary | ICD-10-CM

## 2020-03-28 NOTE — Telephone Encounter (Signed)
Patient is requesting a refill of the following medications: Requested Prescriptions   Pending Prescriptions Disp Refills  . busPIRone (BUSPAR) 5 MG tablet [Pharmacy Med Name: BUSPIRONE HCL 5 MG TABLET] 270 tablet 1    Sig: TAKE 1 TABLET BY MOUTH THREE TIMES A DAY    Date of patient request:03/26/2020 Last office visit: 09/08/2019 Date of last refill: 09/24/2019 Last refill amount:270 tablets and 1 refill Follow up time period per chart:

## 2021-02-27 IMAGING — MR MR MRV HEAD WO/W CM
4 of 10 series · 23 of 48 positions shown · IV contrast (6.5 GAD)
Comparison: None.

CONTRAST:  6.5 cc Gadavist

CLINICAL DATA: Persistent frontal headache over the last 3 weeks.
Assess for venous thrombosis.

EXAM:
MR VENOGRAM HEAD WITHOUT AND WITH CONTRAST
TECHNIQUE: Angiographic images of the intracranial venous structures were
obtained using MRV technique without and with intravenous contrast.

[Series 2: MRV · coronal · 1.5mm · 0.43mm/px · 4 of 125 slices shown]
[im 1/125]
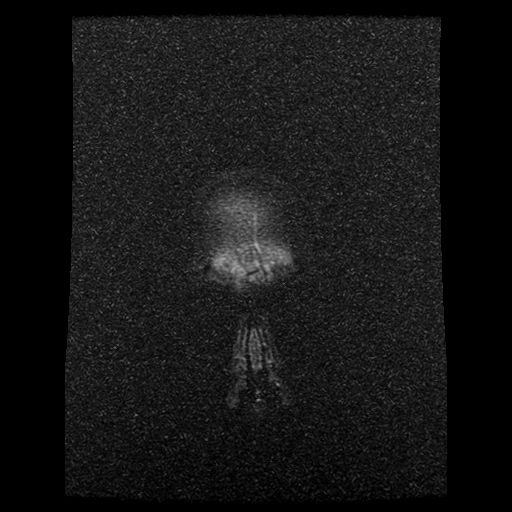
[im 42/125]
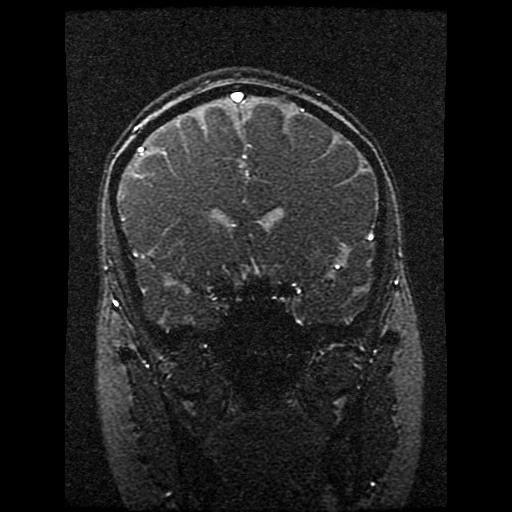
[im 83/125]
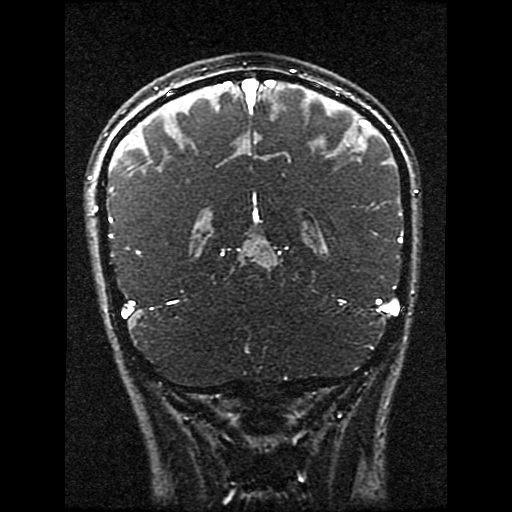
[im 125/125]
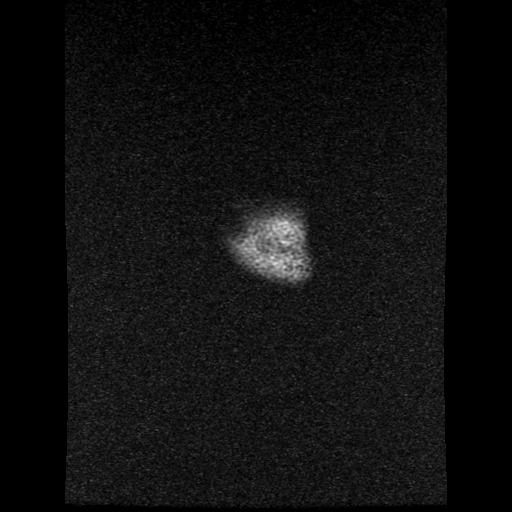

[Series 5: tof_fl2d_paracor · coronal · 2.0mm · 0.98mm/px · 5 of 131 slices shown]
[im 1/131]
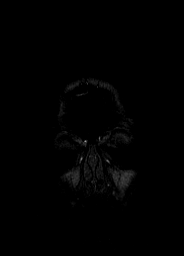
[im 33/131]
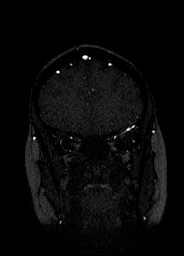
[im 66/131]
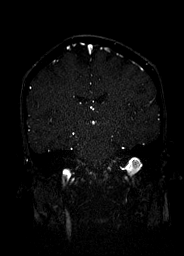
[im 98/131]
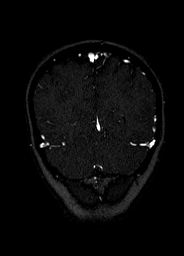
[im 131/131]
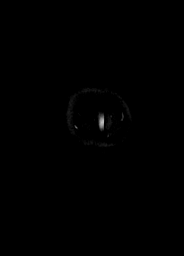

[Series 300: multiplanar reconstruction (mpr) · sagittal · 0.9mm · 0.47mm/px · 8 of 205 slices shown (1 of 2)]
[im 1/205]
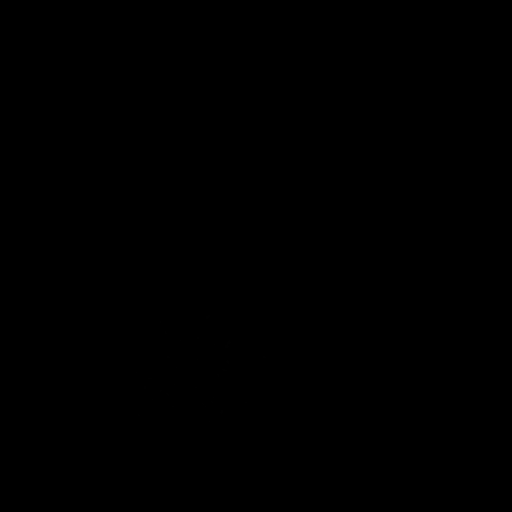
[im 30/205]
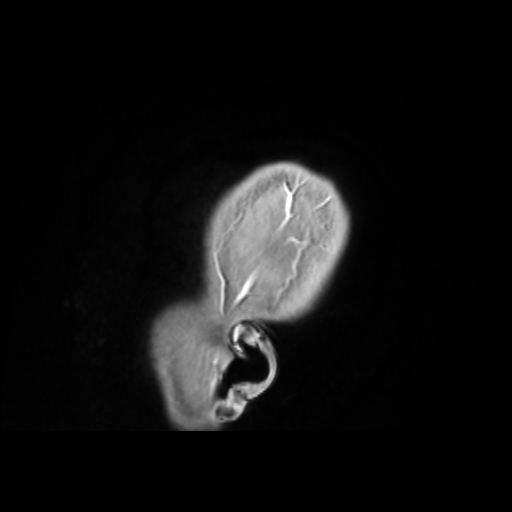
[im 59/205]
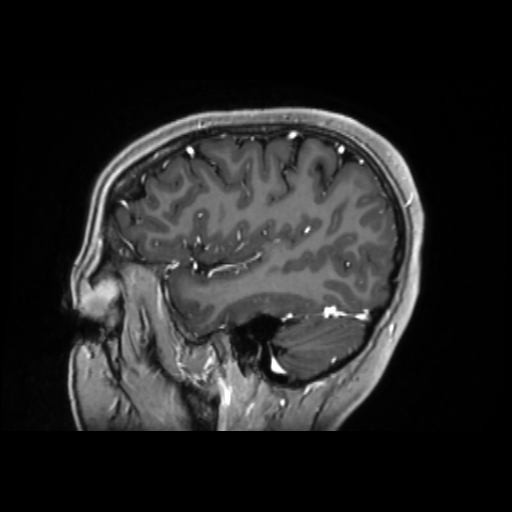
[im 88/205]
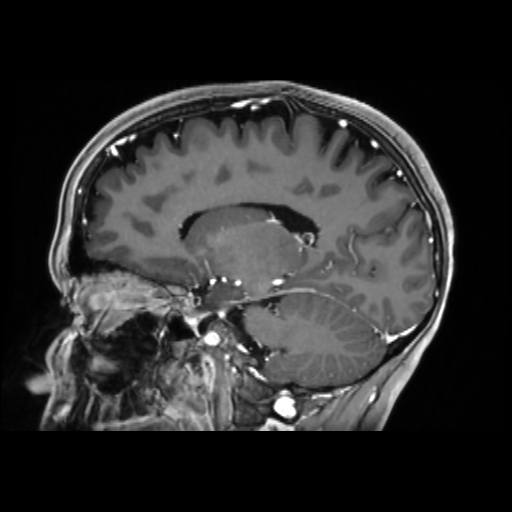
[im 117/205]
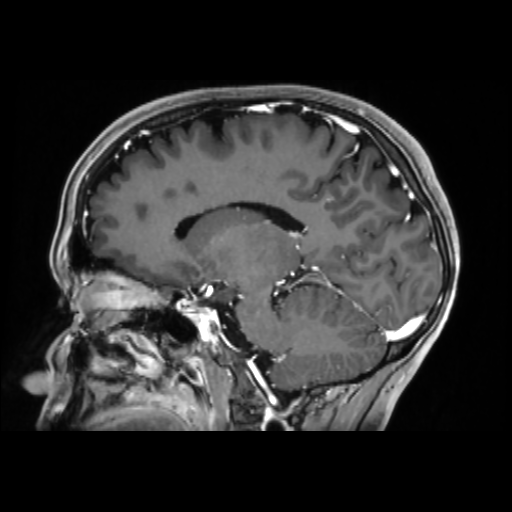
[im 146/205]
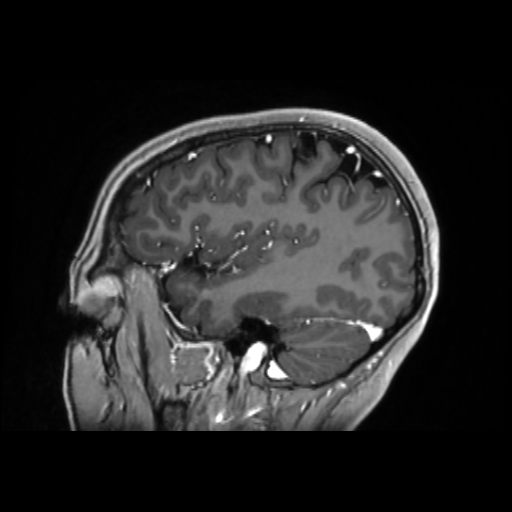
[im 175/205]
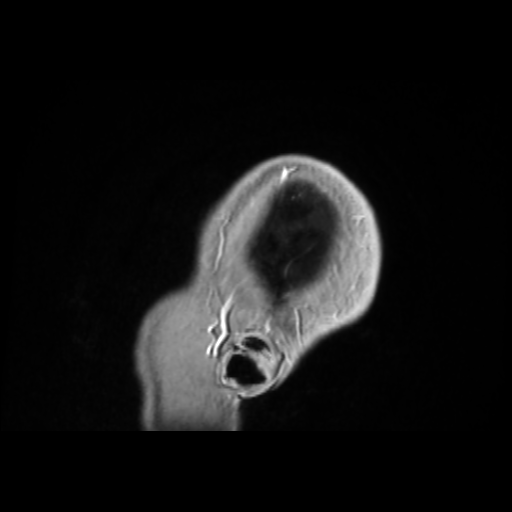
[im 205/205]
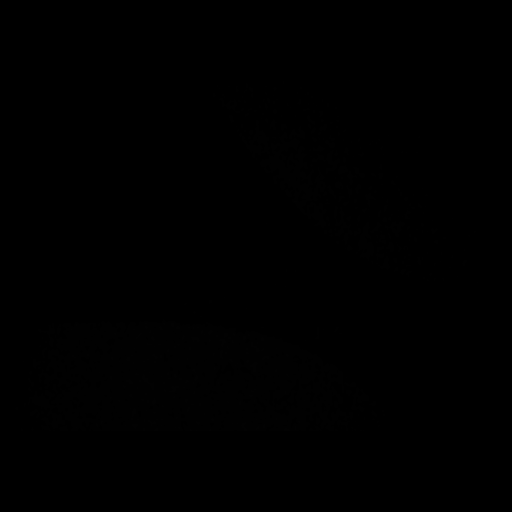

[Series 301: multiplanar reconstruction (mpr) · coronal · 0.9mm · 0.47mm/px · 6 of 255 slices shown (2 of 2)]
[im 1/255]
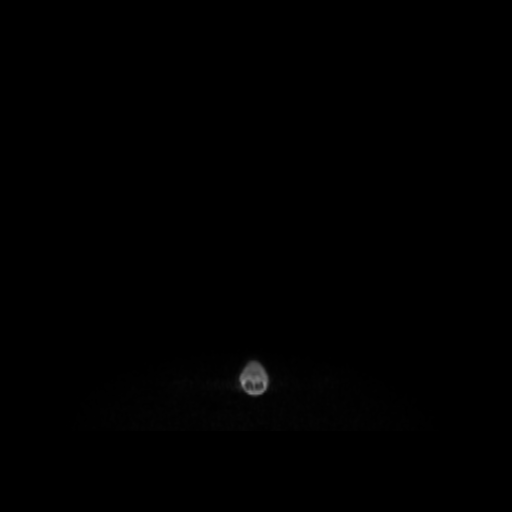
[im 29/255]
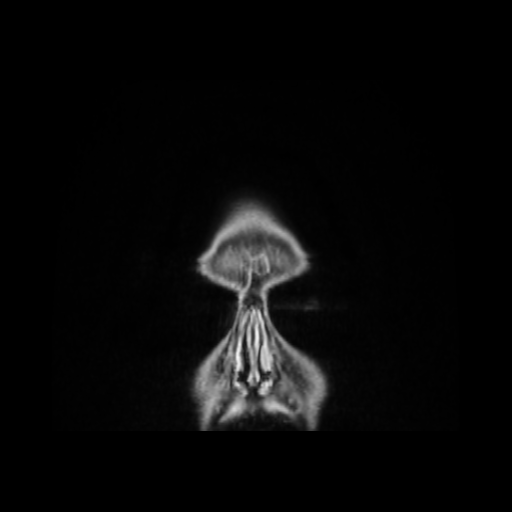
[im 85/255]
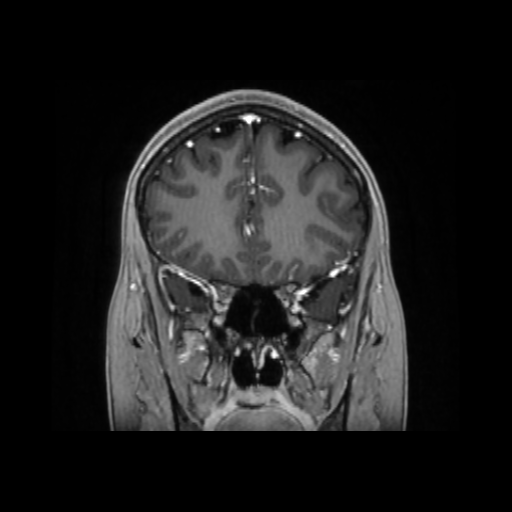
[im 113/255]
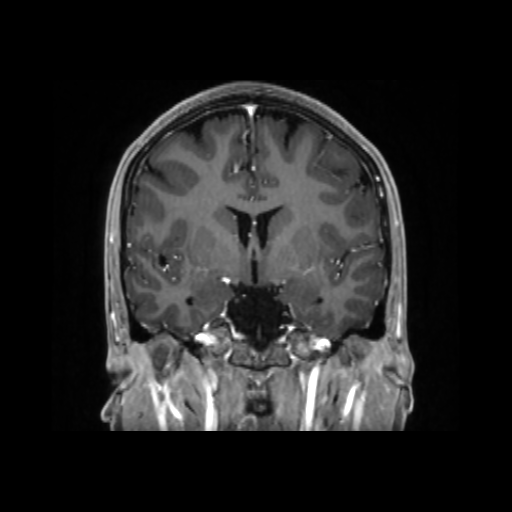
[im 142/255]
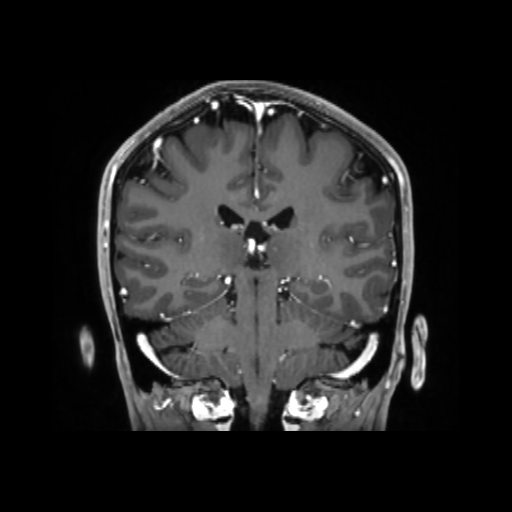
[im 226/255]
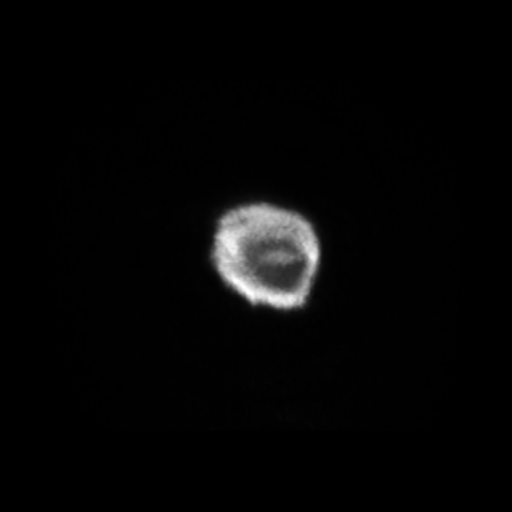

[23 of 48 positions shown; findings below may reference images not displayed]

FINDINGS: Superior sagittal sinus is widely patent. Transverse and sigmoid
sinuses are normal. Both jugular veins show flow. No superficial
thrombosis identified. Deep system appears normal.

No abnormal brain or leptomeningeal enhancement is identified. Brain
appears normal on sequences employed for the vascular studies.
IMPRESSION: Normal intracranial MR venography. No sign of venous thrombosis as
an etiology for headache.
